# Patient Record
Sex: Female | Born: 1965 | Race: White | Hispanic: No | Marital: Married | Smoking: Current every day smoker
Health system: Southern US, Community
[De-identification: ages and names within clinical notes are randomized; demographics above are authoritative.]

## PROBLEM LIST (undated history)

## (undated) DIAGNOSIS — T7840XA Allergy, unspecified, initial encounter: Secondary | ICD-10-CM

## (undated) HISTORY — DX: Allergy, unspecified, initial encounter: T78.40XA

---

## 1998-04-05 ENCOUNTER — Inpatient Hospital Stay (HOSPITAL_COMMUNITY): Admission: AD | Admit: 1998-04-05 | Discharge: 1998-04-05 | Payer: Self-pay | Admitting: Obstetrics and Gynecology

## 1998-04-12 ENCOUNTER — Inpatient Hospital Stay (HOSPITAL_COMMUNITY): Admission: AD | Admit: 1998-04-12 | Discharge: 1998-04-12 | Payer: Self-pay | Admitting: Obstetrics & Gynecology

## 1998-04-28 ENCOUNTER — Inpatient Hospital Stay (HOSPITAL_COMMUNITY): Admission: AD | Admit: 1998-04-28 | Discharge: 1998-05-01 | Payer: Self-pay | Admitting: *Deleted

## 1998-11-30 ENCOUNTER — Other Ambulatory Visit: Admission: RE | Admit: 1998-11-30 | Discharge: 1998-11-30 | Payer: Self-pay | Admitting: Obstetrics and Gynecology

## 1999-12-26 ENCOUNTER — Other Ambulatory Visit: Admission: RE | Admit: 1999-12-26 | Discharge: 1999-12-26 | Payer: Self-pay | Admitting: Obstetrics and Gynecology

## 2001-07-31 ENCOUNTER — Encounter: Admission: RE | Admit: 2001-07-31 | Discharge: 2001-07-31 | Payer: Self-pay | Admitting: Obstetrics and Gynecology

## 2001-07-31 ENCOUNTER — Encounter: Payer: Self-pay | Admitting: Obstetrics and Gynecology

## 2001-11-20 ENCOUNTER — Other Ambulatory Visit: Admission: RE | Admit: 2001-11-20 | Discharge: 2001-11-20 | Payer: Self-pay | Admitting: Obstetrics and Gynecology

## 2002-01-16 ENCOUNTER — Encounter: Payer: Self-pay | Admitting: Obstetrics and Gynecology

## 2002-01-16 ENCOUNTER — Ambulatory Visit (HOSPITAL_COMMUNITY): Admission: RE | Admit: 2002-01-16 | Discharge: 2002-01-16 | Payer: Self-pay | Admitting: Obstetrics and Gynecology

## 2002-04-08 ENCOUNTER — Ambulatory Visit (HOSPITAL_COMMUNITY): Admission: RE | Admit: 2002-04-08 | Discharge: 2002-04-08 | Payer: Self-pay | Admitting: Obstetrics and Gynecology

## 2002-04-08 ENCOUNTER — Encounter: Payer: Self-pay | Admitting: Obstetrics and Gynecology

## 2002-05-20 ENCOUNTER — Ambulatory Visit (HOSPITAL_COMMUNITY): Admission: RE | Admit: 2002-05-20 | Discharge: 2002-05-20 | Payer: Self-pay | Admitting: Obstetrics and Gynecology

## 2002-05-20 ENCOUNTER — Encounter: Payer: Self-pay | Admitting: Obstetrics and Gynecology

## 2002-06-03 ENCOUNTER — Ambulatory Visit (HOSPITAL_COMMUNITY): Admission: RE | Admit: 2002-06-03 | Discharge: 2002-06-03 | Payer: Self-pay | Admitting: Obstetrics and Gynecology

## 2002-06-03 ENCOUNTER — Encounter: Payer: Self-pay | Admitting: Obstetrics and Gynecology

## 2002-06-17 ENCOUNTER — Inpatient Hospital Stay (HOSPITAL_COMMUNITY): Admission: AD | Admit: 2002-06-17 | Discharge: 2002-06-19 | Payer: Self-pay | Admitting: Obstetrics and Gynecology

## 2014-06-24 ENCOUNTER — Encounter: Payer: Self-pay | Admitting: Internal Medicine

## 2015-03-23 ENCOUNTER — Encounter: Payer: Self-pay | Admitting: Physician Assistant

## 2015-03-23 ENCOUNTER — Ambulatory Visit (INDEPENDENT_AMBULATORY_CARE_PROVIDER_SITE_OTHER): Payer: No Typology Code available for payment source | Admitting: Emergency Medicine

## 2015-03-23 ENCOUNTER — Ambulatory Visit (INDEPENDENT_AMBULATORY_CARE_PROVIDER_SITE_OTHER): Payer: No Typology Code available for payment source

## 2015-03-23 VITALS — BP 112/66 | HR 73 | Temp 98.1°F | Resp 18 | Ht 66.5 in | Wt 335.0 lb

## 2015-03-23 DIAGNOSIS — R079 Chest pain, unspecified: Secondary | ICD-10-CM

## 2015-03-23 DIAGNOSIS — J209 Acute bronchitis, unspecified: Secondary | ICD-10-CM | POA: Diagnosis not present

## 2015-03-23 DIAGNOSIS — R062 Wheezing: Secondary | ICD-10-CM

## 2015-03-23 LAB — POCT CBC
Granulocyte percent: 70.8 %G (ref 37–80)
HCT, POC: 38.6 % (ref 37.7–47.9)
Hemoglobin: 12.7 g/dL (ref 12.2–16.2)
Lymph, poc: 1.1 (ref 0.6–3.4)
MCH, POC: 24.7 pg — AB (ref 27–31.2)
MCHC: 33 g/dL (ref 31.8–35.4)
MCV: 74.7 fL — AB (ref 80–97)
MID (cbc): 0.4 (ref 0–0.9)
MPV: 6.5 fL (ref 0–99.8)
POC Granulocyte: 3.6 (ref 2–6.9)
POC LYMPH PERCENT: 21.8 %L (ref 10–50)
POC MID %: 7.4 %M (ref 0–12)
Platelet Count, POC: 277 10*3/uL (ref 142–424)
RBC: 5.17 M/uL (ref 4.04–5.48)
RDW, POC: 15.6 %
WBC: 5.1 10*3/uL (ref 4.6–10.2)

## 2015-03-23 LAB — COMPREHENSIVE METABOLIC PANEL
ALT: 12 U/L (ref 6–29)
AST: 14 U/L (ref 10–35)
Albumin: 3.9 g/dL (ref 3.6–5.1)
Alkaline Phosphatase: 74 U/L (ref 33–130)
BUN: 11 mg/dL (ref 7–25)
CO2: 27 mmol/L (ref 20–31)
Calcium: 9.1 mg/dL (ref 8.6–10.4)
Chloride: 102 mmol/L (ref 98–110)
Creat: 0.63 mg/dL (ref 0.50–1.05)
Glucose, Bld: 85 mg/dL (ref 65–99)
Potassium: 3.9 mmol/L (ref 3.5–5.3)
Sodium: 139 mmol/L (ref 135–146)
Total Bilirubin: 1 mg/dL (ref 0.2–1.2)
Total Protein: 6.6 g/dL (ref 6.1–8.1)

## 2015-03-23 MED ORDER — IPRATROPIUM BROMIDE 0.02 % IN SOLN
0.5000 mg | Freq: Once | RESPIRATORY_TRACT | Status: AC
  Administered 2015-03-23: 0.5 mg via RESPIRATORY_TRACT

## 2015-03-23 MED ORDER — ALBUTEROL SULFATE (2.5 MG/3ML) 0.083% IN NEBU
2.5000 mg | INHALATION_SOLUTION | Freq: Once | RESPIRATORY_TRACT | Status: AC
  Administered 2015-03-23: 2.5 mg via RESPIRATORY_TRACT

## 2015-03-23 MED ORDER — PREDNISONE 20 MG PO TABS: ORAL_TABLET | ORAL | Status: AC

## 2015-03-23 MED ORDER — AZITHROMYCIN 250 MG PO TABS: ORAL_TABLET | ORAL | Status: AC

## 2015-03-23 MED ORDER — ALBUTEROL SULFATE HFA 108 (90 BASE) MCG/ACT IN AERS: 2.0000 | INHALATION_SPRAY | RESPIRATORY_TRACT | Status: AC | PRN

## 2015-03-23 NOTE — Patient Instructions (Signed)
zpak as directed Prednisone as directed, taking in the mornings. Do not use prednisone with other products containing ibuprofen, naprosyn or aspirin. You may use tylenol with this medication. Use albuterol every 4 hours as needed for wheezing. I have referred you to cardiology for further work up. You will get a phone call to make this appt. Return to see me in 1-2 months for cpe and blood work.

## 2015-03-23 NOTE — Progress Notes (Signed)
[facility] [address] [place] [address] [phone]  Date:  03/23/2015   Name:  Joanne Chandler   DOB:  1965/07/01   MRN:  [id number]  PCP:  No primary care provider on file.    Chief Complaint: Cough; Back Pain; Arm Pain; and Chest Pain   History of Present Illness:  This is a 49 y.o. female with PMH allergic rhinitis who is presenting with 1 month of cough. Cough was getting better after a couple weeks. She continued to cough but not as bad. She treated with delsym and mucinex. Over the past 2-3 days her cough returned and she is having a lot of wheezing with sob. Cough is dry. Used her daughter's albuterol inhaler a couple times which helps. States she is prone to bronchitis.  Pt is also complaining of intermittent chest pain x 6 months. Located to left side of chest, under armpit. Would occur once every 6 weeks or so and would last an entire day and be better the next day. Would occur usually with rest, not with exertion. Rides a bike "every once in a while" for exercise, never gets cp with that. 1 week ago started getting a constant chest pain. Described as dull ache with intermittent sharp pains. Worse with inspiration and neck extension. She has started experiencing new pains into her left shoulder and left back. Pains extend down left arm and she states her hand feels weak. Denies n/v, dizziness, palps, diaphoresis.   Does not have a PCP.  No PMH except for allergic rhinitis, as far as she knows. CAD - PGM, MGF. Mom and dad are healthy.  Review of Systems:  Review of Systems See HPI  There are no active problems to display for this patient.   Prior to Admission medications   Not on File    No Known Allergies  History reviewed. No pertinent past surgical history.  Social History  Substance Use Topics  . Smoking status: Current Every Day Smoker  . Smokeless tobacco: None  . Alcohol Use: 0.6 - 1.2 oz/week    1-2 Standard drinks or  equivalent per week    Family History  Problem Relation Age of Onset  . Hyperlipidemia Mother   . Cancer Father   . Cancer Maternal Grandmother   . Heart disease Maternal Grandfather   . Heart disease Paternal Grandmother     Medication list has been reviewed and updated.  Physical Examination:  Physical Exam  Constitutional: She is oriented to person, place, and time. She appears well-developed and well-nourished. No distress.  HENT:  Head: Normocephalic and atraumatic.  Right Ear: Hearing, tympanic membrane, external ear and ear canal normal.  Left Ear: Hearing, tympanic membrane, external ear and ear canal normal.  Nose: Nose normal.  Mouth/Throat: Uvula is midline, oropharynx is clear and moist and mucous membranes are normal.  Eyes: Conjunctivae, EOM and lids are normal. Pupils are equal, round, and reactive to light. Right eye exhibits no discharge. Left eye exhibits no discharge. No scleral icterus.  Neck: Trachea normal. Carotid bruit is not present. No thyromegaly present.  Cardiovascular: Normal rate, regular rhythm, normal heart sounds and normal pulses.   No murmur heard. Pulmonary/Chest: Effort normal. No respiratory distress. She has wheezes (diffuse). She has rhonchi. She has no rales.    Pain reproduced with palpation over left lateral chest Pain reproduced with palpation left trapezius Very large breasts.  Abdominal: Soft. Normal appearance. There is no tenderness.  Musculoskeletal:  Normal range of motion.  Lymphadenopathy:       Head (right side): No submental, no submandibular and no tonsillar adenopathy present.       Head (left side): No submental, no submandibular and no tonsillar adenopathy present.    She has no cervical adenopathy.  Neurological: She is alert and oriented to person, place, and time.  Skin: Skin is warm, dry and intact. No lesion and no rash noted.  No LE edema  Psychiatric: She has a normal mood and affect. Her speech is normal and  behavior is normal. Thought content normal.    BP 112/66 mmHg  Pulse 73  Temp(Src) 98.1 F (36.7 C) (Oral)  Resp 18  Ht 5' 6.5" (1.689 m)  Wt 335 lb (151.955 kg)  BMI 53.27 kg/m2  SpO2 96%  EKG interpreted with Dr. Dareen PianoAnderson: NSR, no signs of ischemia  Chest radiograph IMPRESSION: No active disease.  Results for orders placed or performed in visit on 03/23/15  POCT CBC  Result Value Ref Range   WBC 5.1 4.6 - 10.2 K/uL   Lymph, poc 1.1 0.6 - 3.4   POC LYMPH PERCENT 21.8 10 - 50 %L   MID (cbc) 0.4 0 - 0.9   POC MID % 7.4 0 - 12 %M   POC Granulocyte 3.6 2 - 6.9   Granulocyte percent 70.8 37 - 80 %G   RBC 5.17 4.04 - 5.48 M/uL   Hemoglobin 12.7 12.2 - 16.2 g/dL   HCT, POC 95.238.6 84.137.7 - 47.9 %   MCV 74.7 (A) 80 - 97 fL   MCH, POC 24.7 (A) 27 - 31.2 pg   MCHC 33.0 31.8 - 35.4 g/dL   RDW, POC 32.415.6 %   Platelet Count, POC 277 142 - 424 K/uL   MPV 6.5 0 - 99.8 fL    Assessment and Plan:  1. Acute bronchitis, unspecified organism 2. Wheezing  CBC and negative CXR reassuring however, d/t cough x 1 month and diffuse wheezing will treat with zpak and prednisone. Albuterol inhaler prn wheezing. Return in 1 week if sx not improved. - predniSONE (DELTASONE) 20 MG tablet; Take 3 PO QAM x3days, 2 PO QAM x3days, 1 PO QAM x3days  Dispense: 18 tablet; Refill: 0 - azithromycin (ZITHROMAX) 250 MG tablet; Take 2 tabs PO x 1 dose, then 1 tab PO QD x 4 days  Dispense: 6 tablet; Refill: 0 - POCT CBC - albuterol (PROVENTIL) (2.5 MG/3ML) 0.083% nebulizer solution 2.5 mg; Take 3 mLs (2.5 mg total) by nebulization once. - ipratropium (ATROVENT) nebulizer solution 0.5 mg; Take 2.5 mLs (0.5 mg total) by nebulization once. - albuterol (PROVENTIL HFA;VENTOLIN HFA) 108 (90 Base) MCG/ACT inhaler; Inhale 2 puffs into the lungs every 4 (four) hours as needed for wheezing or shortness of breath (cough, shortness of breath or wheezing.).  Dispense: 1 Inhaler; Refill: 1  3. Chest pain, unspecified chest pain  type EKG normal. Chest pain seems MSK in nature since able to reproduce with palpation. Possible that pain could be from very large breast tissue. However, since cp has been occuring intermittently over the last 6 months, will refer to cardiology for further evaluation. - EKG 12-Lead - Comprehensive metabolic panel - DG Chest 2 View; Future - Ambulatory referral to Cardiology   Roswell MinersNicole V. Dyke BrackettBush, PA-C, MHS [facility]  03/23/2015

## 2015-03-24 NOTE — Progress Notes (Signed)
  Medical screening examination/treatment/procedure(s) were performed by non-physician practitioner and as supervising physician I was immediately available for consultation/collaboration.     

## 2015-03-25 ENCOUNTER — Ambulatory Visit: Payer: Self-pay | Admitting: Physician Assistant

## 2015-12-31 ENCOUNTER — Other Ambulatory Visit: Payer: Self-pay | Admitting: Physician Assistant

## 2015-12-31 DIAGNOSIS — R062 Wheezing: Secondary | ICD-10-CM

## 2016-10-06 IMAGING — CR DG CHEST 2V
3 series · 3 of 3 positions shown · non-contrast
Comparison: None.

CLINICAL DATA: Chest pain

EXAM:
CHEST - 2 VIEW

[PA]
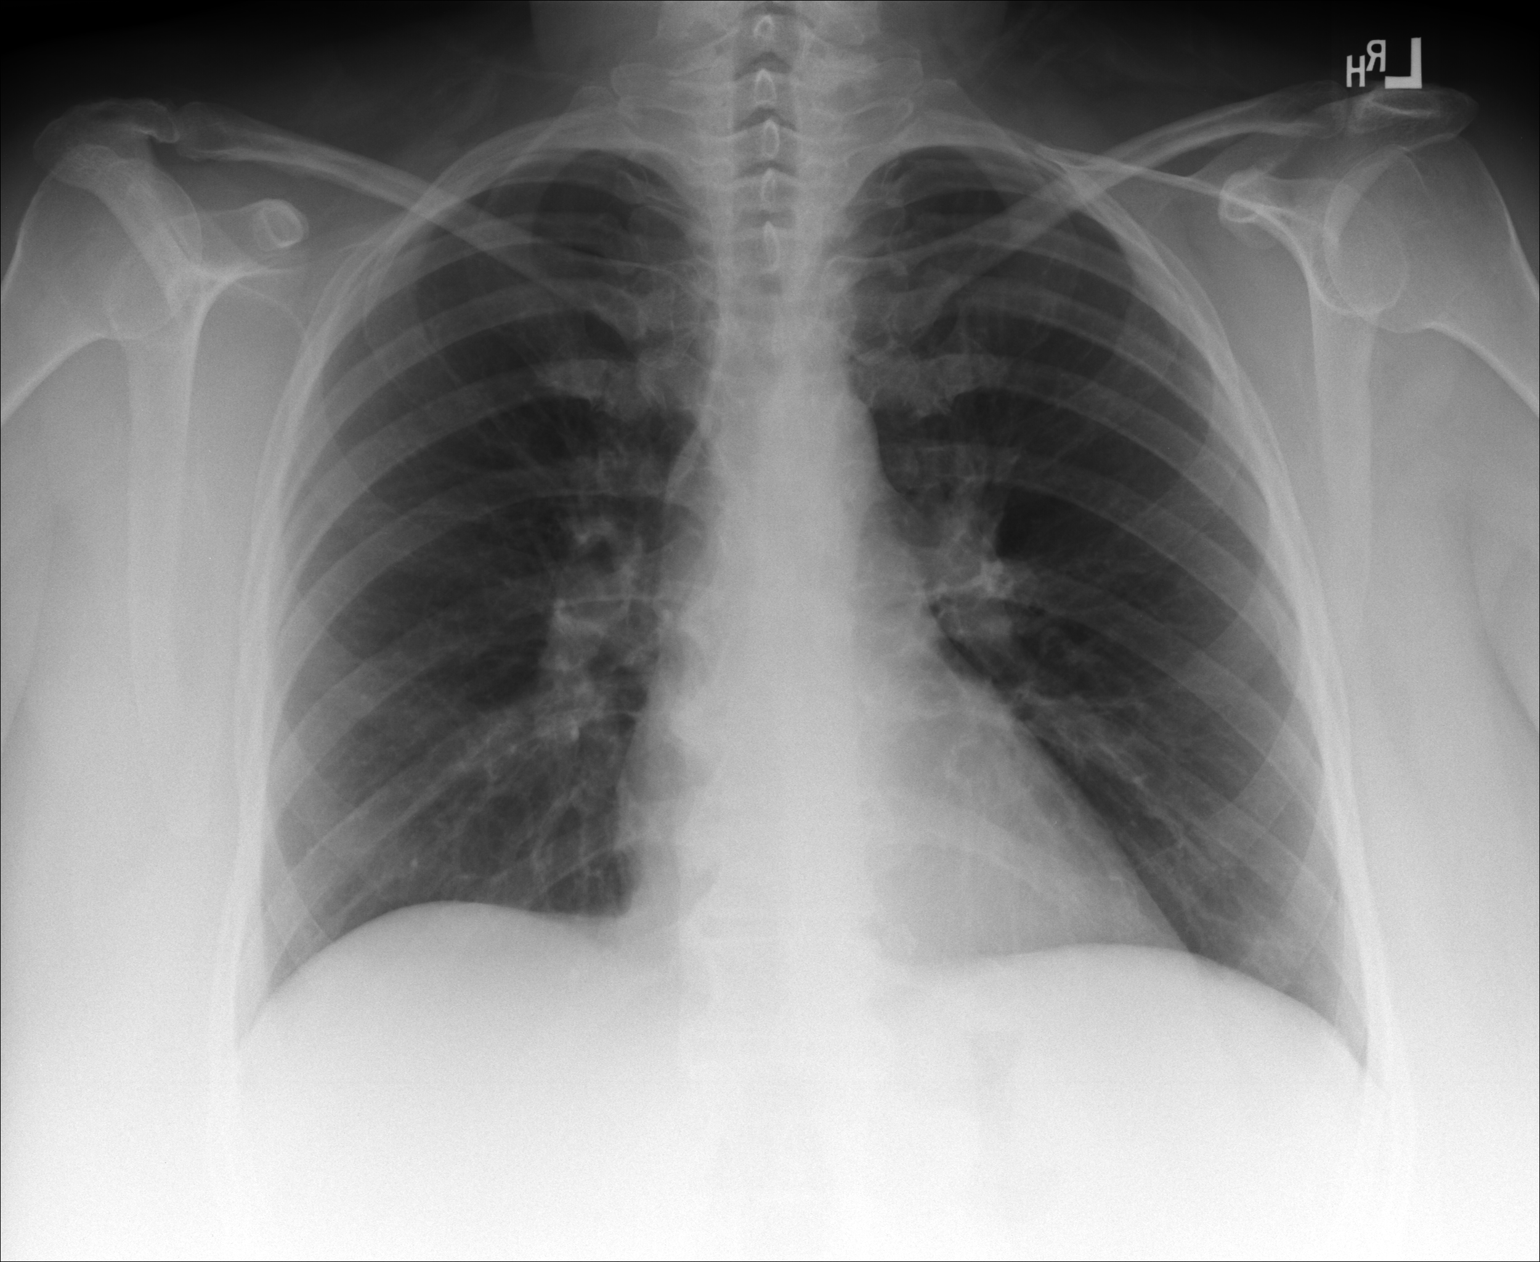

[lateral (1 of 2)]
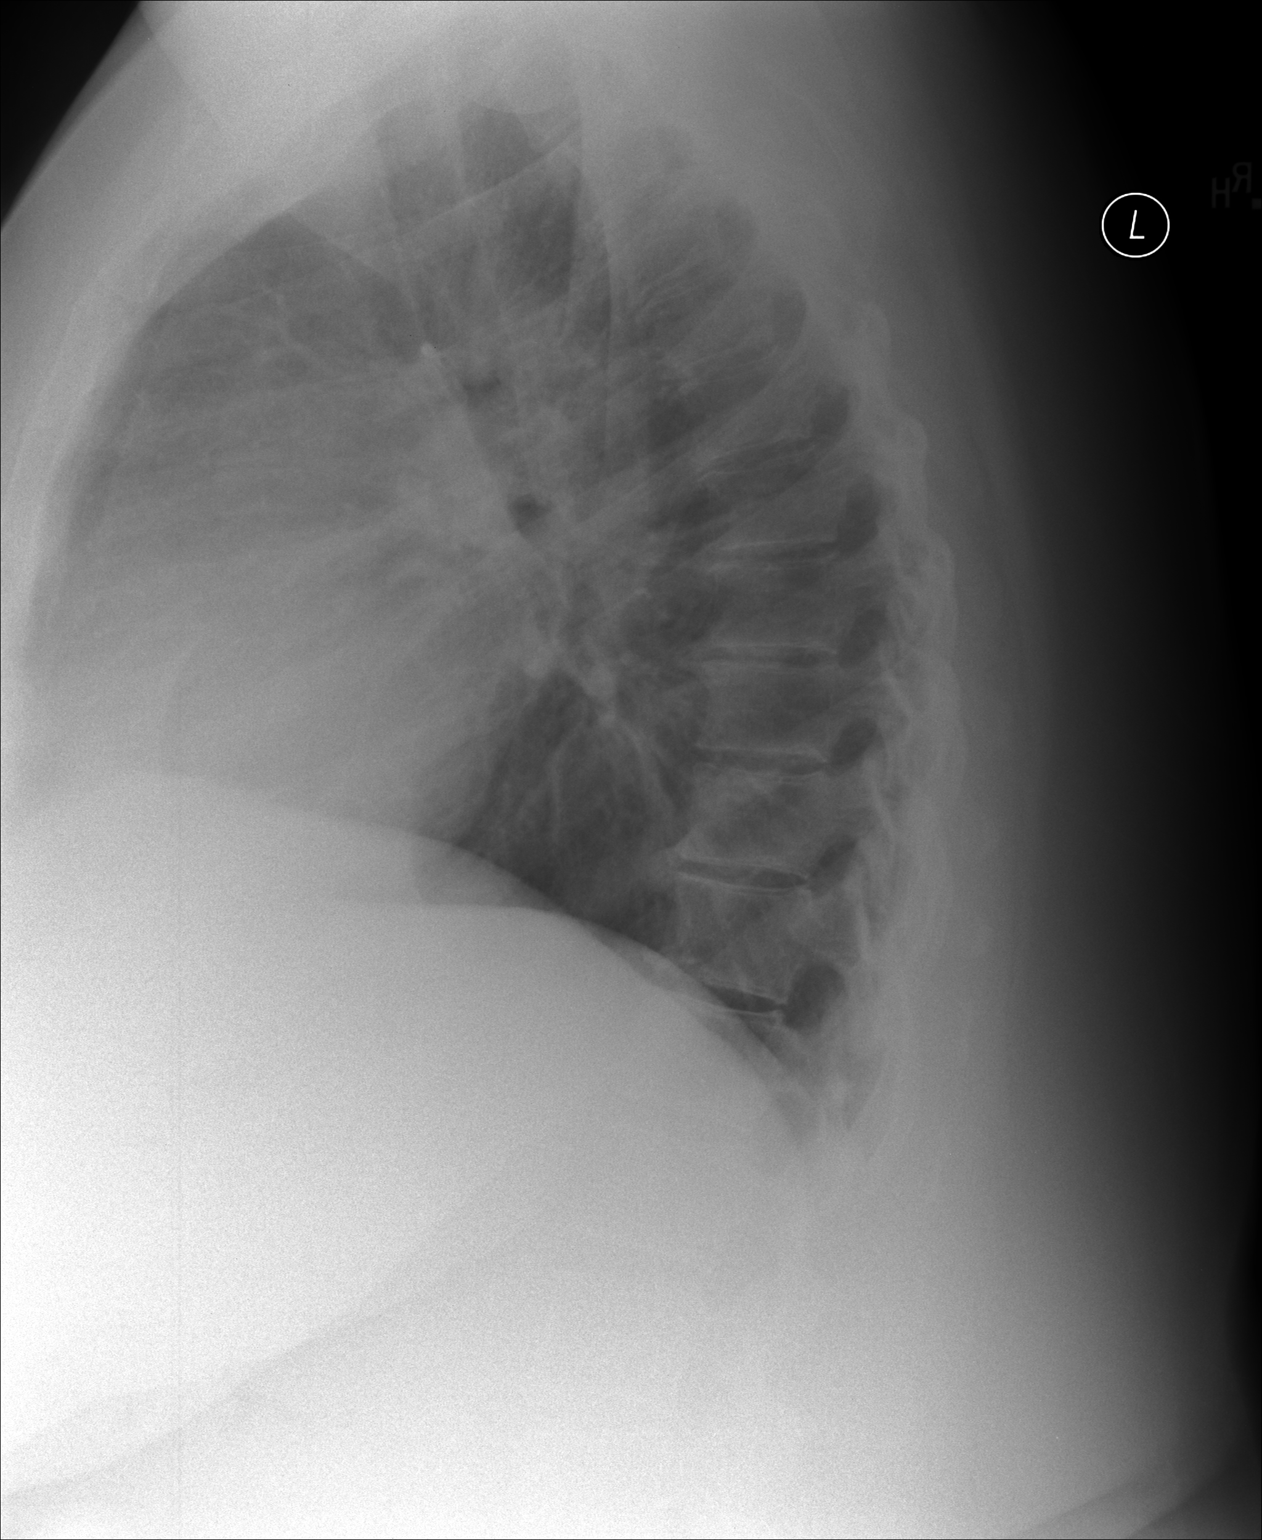

[lateral (2 of 2)]
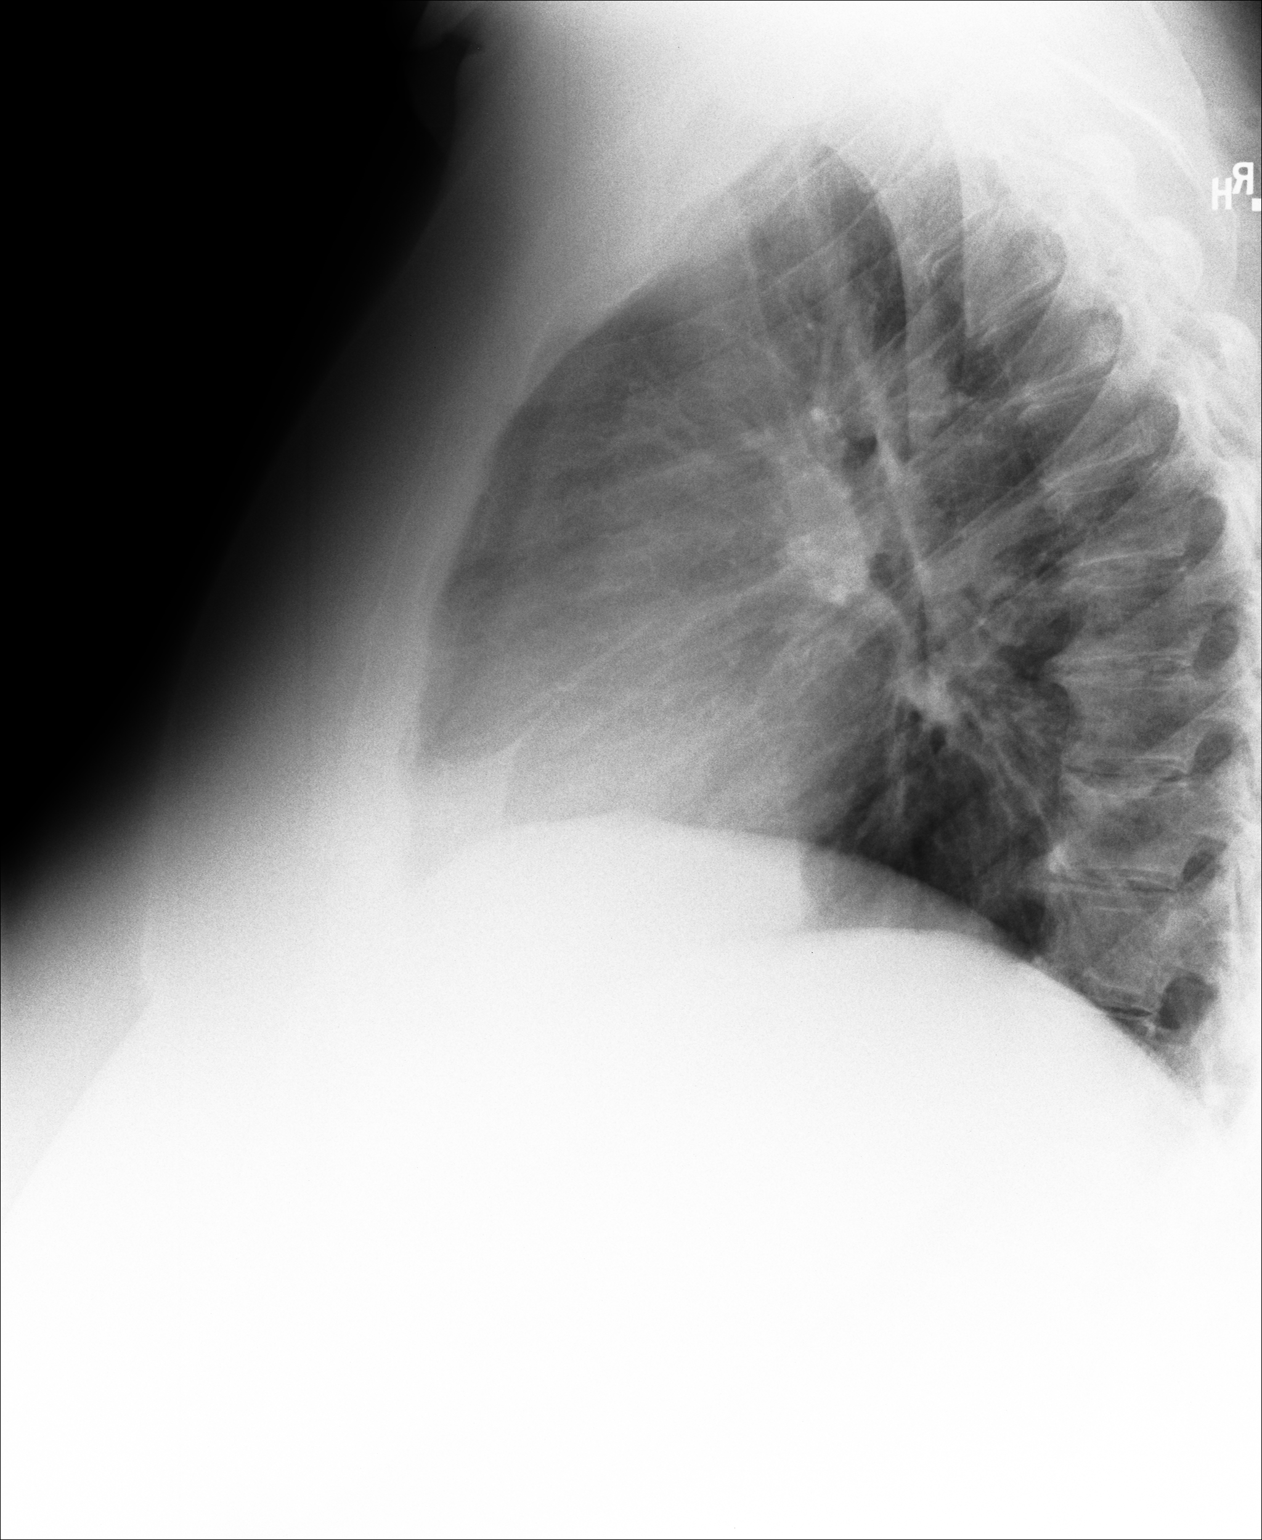

[3 of 3 positions shown; findings below may reference images not displayed]

FINDINGS: The heart size and mediastinal contours are within normal limits.
Both lungs are clear. The visualized skeletal structures are
unremarkable.
IMPRESSION: No active disease.
